# Patient Record
Sex: Female | Born: 2005 | Hispanic: Yes | Marital: Single | Smoking: Never smoker
Health system: Southern US, Community
[De-identification: ages and names within clinical notes are randomized; demographics above are authoritative.]

## PROBLEM LIST (undated history)

## (undated) DIAGNOSIS — S0990XA Unspecified injury of head, initial encounter: Secondary | ICD-10-CM

## (undated) HISTORY — PX: BRAIN SURGERY: SHX531

---
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
## 2024-01-09 DIAGNOSIS — Z419 Encounter for procedure for purposes other than remedying health state, unspecified: Secondary | ICD-10-CM | POA: Diagnosis not present

## 2024-02-09 DIAGNOSIS — Z419 Encounter for procedure for purposes other than remedying health state, unspecified: Secondary | ICD-10-CM | POA: Diagnosis not present

## 2024-03-10 DIAGNOSIS — Z419 Encounter for procedure for purposes other than remedying health state, unspecified: Secondary | ICD-10-CM | POA: Diagnosis not present

## 2024-04-10 DIAGNOSIS — Z419 Encounter for procedure for purposes other than remedying health state, unspecified: Secondary | ICD-10-CM | POA: Diagnosis not present

## 2024-05-11 DIAGNOSIS — Z419 Encounter for procedure for purposes other than remedying health state, unspecified: Secondary | ICD-10-CM | POA: Diagnosis not present

## 2024-06-10 DIAGNOSIS — Z419 Encounter for procedure for purposes other than remedying health state, unspecified: Secondary | ICD-10-CM | POA: Diagnosis not present

## 2024-07-11 DIAGNOSIS — Z419 Encounter for procedure for purposes other than remedying health state, unspecified: Secondary | ICD-10-CM | POA: Diagnosis not present
